# Patient Record
Sex: Female | Born: 2000 | Race: White | Hispanic: Yes | Marital: Single | Smoking: Current every day smoker
Health system: Southern US, Community
[De-identification: ages and names within clinical notes are randomized; demographics above are authoritative.]

## PROBLEM LIST (undated history)

## (undated) HISTORY — PX: APPENDECTOMY: SHX54

---
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
## 2019-03-27 ENCOUNTER — Ambulatory Visit: Payer: Self-pay | Attending: Internal Medicine

## 2019-03-27 DIAGNOSIS — Z20822 Contact with and (suspected) exposure to covid-19: Secondary | ICD-10-CM | POA: Insufficient documentation

## 2019-03-29 LAB — NOVEL CORONAVIRUS, NAA: SARS-CoV-2, NAA: NOT DETECTED

## 2019-12-20 ENCOUNTER — Encounter (HOSPITAL_COMMUNITY): Payer: Self-pay | Admitting: Emergency Medicine

## 2019-12-20 ENCOUNTER — Other Ambulatory Visit: Payer: Self-pay

## 2019-12-20 ENCOUNTER — Emergency Department (HOSPITAL_COMMUNITY)
Admission: EM | Admit: 2019-12-20 | Discharge: 2019-12-20 | Disposition: A | Discharge status: Home / Self Care | Payer: Self-pay | Attending: Emergency Medicine | Admitting: Emergency Medicine

## 2019-12-20 ENCOUNTER — Emergency Department (HOSPITAL_COMMUNITY): Payer: Self-pay

## 2019-12-20 DIAGNOSIS — R112 Nausea with vomiting, unspecified: Secondary | ICD-10-CM | POA: Insufficient documentation

## 2019-12-20 DIAGNOSIS — K219 Gastro-esophageal reflux disease without esophagitis: Secondary | ICD-10-CM | POA: Insufficient documentation

## 2019-12-20 DIAGNOSIS — R197 Diarrhea, unspecified: Secondary | ICD-10-CM | POA: Insufficient documentation

## 2019-12-20 DIAGNOSIS — F17210 Nicotine dependence, cigarettes, uncomplicated: Secondary | ICD-10-CM | POA: Insufficient documentation

## 2019-12-20 LAB — COMPREHENSIVE METABOLIC PANEL
ALT: 13 U/L (ref 0–44)
AST: 20 U/L (ref 15–41)
Albumin: 4.5 g/dL (ref 3.5–5.0)
Alkaline Phosphatase: 48 U/L (ref 38–126)
Anion gap: 10 (ref 5–15)
BUN: 10 mg/dL (ref 6–20)
CO2: 23 mmol/L (ref 22–32)
Calcium: 9.6 mg/dL (ref 8.9–10.3)
Chloride: 104 mmol/L (ref 98–111)
Creatinine, Ser: 0.76 mg/dL (ref 0.44–1.00)
GFR, Estimated: >60 mL/min (ref >60)
Glucose, Bld: 111 mg/dL — ABNORMAL HIGH (ref 70–99)
Potassium: 3.6 mmol/L (ref 3.5–5.1)
Sodium: 137 mmol/L (ref 135–145)
Total Bilirubin: 1 mg/dL (ref 0.3–1.2)
Total Protein: 7.6 g/dL (ref 6.5–8.1)

## 2019-12-20 LAB — CBC
HCT: 39.8 % (ref 36.0–46.0)
Hemoglobin: 13.1 g/dL (ref 12.0–15.0)
MCH: 29.8 pg (ref 26.0–34.0)
MCHC: 32.9 g/dL (ref 30.0–36.0)
MCV: 90.7 fL (ref 80.0–100.0)
Platelets: 288 10*3/uL (ref 150–400)
RBC: 4.39 MIL/uL (ref 3.87–5.11)
RDW: 13.4 % (ref 11.5–15.5)
WBC: 5.6 10*3/uL (ref 4.0–10.5)
nRBC: 0 % (ref 0.0–0.2)

## 2019-12-20 LAB — I-STAT BETA HCG BLOOD, ED (MC, WL, AP ONLY): I-stat hCG, quantitative: <5 m[IU]/mL (ref <5)

## 2019-12-20 LAB — LIPASE, BLOOD: Lipase: 27 U/L (ref 11–51)

## 2019-12-20 MED ORDER — FENTANYL CITRATE (PF) 100 MCG/2ML IJ SOLN
50.0000 ug | Freq: Once | INTRAMUSCULAR | Status: AC
  Administered 2019-12-20: 50 ug via INTRAVENOUS
  Filled 2019-12-20: qty 2

## 2019-12-20 MED ORDER — SODIUM CHLORIDE 0.9 % IV BOLUS
1000.0000 mL | Freq: Once | INTRAVENOUS | Status: AC
  Administered 2019-12-20: 1000 mL via INTRAVENOUS

## 2019-12-20 MED ORDER — OMEPRAZOLE 20 MG PO CPDR: 20.0000 mg | DELAYED_RELEASE_CAPSULE | Freq: Every day | ORAL | 1 refills | Status: AC

## 2019-12-20 MED ORDER — IOHEXOL 300 MG/ML  SOLN
100.0000 mL | Freq: Once | INTRAMUSCULAR | Status: AC | PRN
  Administered 2019-12-20: 100 mL via INTRAVENOUS

## 2019-12-20 MED ORDER — PANTOPRAZOLE SODIUM 40 MG IV SOLR
40.0000 mg | Freq: Once | INTRAVENOUS | Status: AC
  Administered 2019-12-20: 40 mg via INTRAVENOUS
  Filled 2019-12-20: qty 40

## 2019-12-20 MED ORDER — ONDANSETRON HCL 4 MG/2ML IJ SOLN
4.0000 mg | Freq: Once | INTRAMUSCULAR | Status: AC
  Administered 2019-12-20: 4 mg via INTRAVENOUS
  Filled 2019-12-20: qty 2

## 2019-12-20 NOTE — ED Notes (Signed)
Pt complaining of 10/10 diffuse abdominal pain, describes abdomen of feeling hard.  Pt is crouch on side of bed,states this more comfortable.  Pt states pain woke her up from sleep, states she felt fine prior to going to sleep last night

## 2019-12-20 NOTE — ED Triage Notes (Signed)
Pt reports lower abd pain, nausea, vomiting, and diarrhea that started this morning.

## 2019-12-20 NOTE — ED Provider Notes (Signed)
[facility] EMERGENCY DEPARTMENT Provider Note   CSN: [id number] Arrival date & time: 12/20/19  7619     History Chief Complaint  Patient presents with  . Abdominal Pain    Lauren Fields is a 19 y.o. female with PMH of appendectomy presents to the ED with acute onset abdominal pain with associated nausea that woke her from her sleep this morning.  On my examination, patient is complaining of 10 out of 10 pain.  She is crumpled up on the floor tearful and having difficulty moving.  Her husband is at bedside who reports that he had to carry her to the ED.  She states that this feels similar to her previous appendicitis, however worse.  She reports that her last menses was approximately 3 weeks ago and has low suspicion for pregnancy.  She states that she had an episode of relatively loose stools this morning and is still passing gas.  She also is endorsing mild nausea and had one episode of nonbloody emesis.  She denies any chest pain or shortness of breath.  No other abdominal surgeries.  No obvious precipitating injury.  She felt perfectly fine when she went to bed last evening.  No recent fevers or chills, infection, or other symptoms.  She denies any vaginal bleeding or discharge.  HPI     History reviewed. No pertinent past medical history.  There are no problems to display for this patient.   Past Surgical History:  Procedure Laterality Date  . APPENDECTOMY       OB History   No obstetric history on file.     No family history on file.  Social History   Tobacco Use  . Smoking status: Current Every Day Smoker  . Smokeless tobacco: Never Used  Substance Use Topics  . Alcohol use: Never  . Drug use: Never    Home Medications Prior to Admission medications   Medication Sig Start Date End Date Taking? Authorizing Provider  omeprazole (PRILOSEC) 20 MG capsule Take 1 capsule (20 mg total) by mouth daily. 12/20/19   Lorelee New,  PA-C  ondansetron (ZOFRAN ODT) 4 MG disintegrating tablet Take 1 tablet (4 mg total) by mouth every 8 (eight) hours as needed for nausea or vomiting. 05/23/17   Michela Pitcher A, PA-C    Allergies    Patient has no known allergies.  Review of Systems   Review of Systems All other systems reviewed and negative.   Physical Exam Updated Vital Signs BP 121/76   Pulse 72   Temp 97.6 F (36.4 C) (Oral)   Resp 16   LMP 11/29/2019   SpO2 100%   Physical Exam Physical Exam Vitals and nursing note reviewed. Exam conducted with a chaperone present.  HENT:     Head: Normocephalic and atraumatic.  Eyes:     General: No scleral icterus.    Conjunctiva/sclera: Conjunctivae normal.  Cardiovascular:     Rate and Rhythm: Normal rate.  Regular rhythm.      Pulses: Normal pulses.   Pulmonary:     Effort: Pulmonary effort is normal. No respiratory distress.     Breath sounds: Normal breath sounds. No wheezing or rales.  Abdominal: Soft, nondistended.  No significant TTP noted diffusely.  Mild guarding.  No peritoneal signs.  No overlying skin changes.  No obvious masses appreciated. Musculoskeletal:     Right lower leg: No edema.     Left lower leg: No edema.  Skin:  General: Skin is dry.     Capillary Refill: Capillary refill takes less than 2 seconds.  Neurological:     Mental Status: She is alert and oriented to person, place, and time.     GCS: GCS eye subscore is 4. GCS verbal subscore is 5. GCS motor subscore is 6.  Psychiatric:        Mood and Affect: Mood normal.        Behavior: Behavior normal.        Thought Content: Thought content normal.   ED Results / Procedures / Treatments   Labs (all labs ordered are listed, but only abnormal results are displayed) Labs Reviewed  COMPREHENSIVE METABOLIC PANEL - Abnormal; Notable for the following components:      Result Value   Glucose, Bld 111 (*)    All other components within normal limits  LIPASE, BLOOD  CBC  URINALYSIS,  ROUTINE W REFLEX MICROSCOPIC  I-STAT BETA HCG BLOOD, ED ([facility], [facility], [facility] ONLY)    EKG None  Radiology CT ABDOMEN PELVIS W CONTRAST  Result Date: 12/20/2019 CLINICAL DATA:  Abdominal pain, nausea, vomiting, and diarrhea. Symptoms started this morning. EXAM: CT ABDOMEN AND PELVIS WITH CONTRAST TECHNIQUE: Multidetector CT imaging of the abdomen and pelvis was performed using the standard protocol following bolus administration of intravenous contrast. CONTRAST:  OMNIPAQUE IOHEXOL 300 MG/ML  SOLN COMPARISON:  None. FINDINGS: Lower chest: No acute abnormality. Hepatobiliary: No focal liver abnormality is seen. No radiopaque gallstones, biliary dilatation, or pericholecystic inflammatory changes. Pancreas: Unremarkable. No pancreatic ductal dilatation or surrounding inflammatory changes. Spleen: Normal in size without focal abnormality. Adrenals/Urinary Tract: Adrenal glands are unremarkable. Kidneys are normal, without renal calculi, focal lesion, or hydronephrosis. Bladder is unremarkable. Stomach/Bowel: Stomach and small bowel loops are unremarkable. Prior appendectomy. Vascular/Lymphatic: No significant vascular findings are present. No enlarged abdominal or pelvic lymph nodes. Reproductive: Uterus is present.  No adnexal mass. Other: No free pelvic fluid. Anterior abdominal wall is unremarkable. Musculoskeletal: No acute or significant osseous findings. IMPRESSION: 1. No evidence for acute abnormality. 2. Prior appendectomy. Electronically Signed   By: Norva Pavlov M.D.   On: 12/20/2019 12:10    Procedures Procedures (including critical care time)  Medications Ordered in ED Medications  fentaNYL (SUBLIMAZE) injection 50 mcg (50 mcg Intravenous Given 12/20/19 1130)  ondansetron (ZOFRAN) injection 4 mg (4 mg Intravenous Given 12/20/19 1130)  sodium chloride 0.9 % bolus 1,000 mL (0 mLs Intravenous Stopped 12/20/19 1230)  iohexol (OMNIPAQUE) 300 MG/ML solution 100 mL (100 mLs Intravenous  Contrast Given 12/20/19 1143)  pantoprazole (PROTONIX) injection 40 mg (40 mg Intravenous Given 12/20/19 1408)    ED Course  I have reviewed the triage vital signs and the nursing notes.  Pertinent labs & imaging results that were available during my care of the patient were reviewed by me and considered in my medical decision making (see chart for details).    MDM Rules/Calculators/A&P                          Patient's history and physical exam is concerning for obstruction versus ovarian torsion.  Beta-hCG is negative.  She is denying any vaginal pain, discharge, or bleeding.  She is pointing towards her periumbilical region when describing her pain symptoms.  Laboratory work-up is already been obtained.  Will place IV and provide fentanyl for pain relief in addition to Zofran for nausea symptoms.  We will also provide 1 L IV NS  given her episode of loose stools and emesis.  Her vital signs are stable within normal limits.  While her laboratory work-up is reassuring, she is exquisitely tender on exam and writhing in discomfort.  Will obtain CT abdomen and pelvis with contrast for evaluation of acute intra-abdominal/pelvic pathology.  Labs CBC, CMP, lipase, and i-STAT beta-hCG obtained and entirely unremarkable.  Patient denies any urinary symptoms.  She went to the bathroom during her ED encounter, but urine sample was never collected.  Imaging CT abdomen pelvis was personally reviewed and demonstrates no evidence of acute abnormality.  There are no adnexal masses seen.  No renal stones or hydroureter.  On subsequent evaluation, patient is feeling very much improved with the fentanyl.  She is still having some mild epigastric abdominal burning discomfort.  She states that last night she was out celebrating Halloween drinking alcohol.  She adamantly denies any melena or recent increased NSAID use or smoking.  Lower suspicion for PUD at this time.  Will administer Protonix.  On final  evaluation, patient is feeling entirely improved after Protonix.  She feels prepared for discharge.  She also tells me that she often will have globus sensation and mild GERD symptoms.  I feel as though it is reasonable to treat her GERD with omeprazole 20 mg daily x30 days.  She plans to follow-up with [place] [facility] in the interim to get established with a primary care provider.  Also encouraging Maalox as needed for abortive therapy.  All of the evaluation and work-up results were discussed with the patient and any family at bedside.  Patient and/or family were informed that while patient is appropriate for discharge at this time, some medical emergencies may only develop or become detectable after a period of time.  I specifically instructed patient and/or family to return to return to the ED or seek immediate medical attention for any new or worsening symptoms.  They were provided opportunity to ask any additional questions and have none at this time.  Prior to discharge patient is feeling well, agreeable with plan for discharge home.  They have expressed understanding of verbal discharge instructions as well as return precautions and are agreeable to the plan.    Final Clinical Impression(s) / ED Diagnoses Final diagnoses:  Gastroesophageal reflux disease, unspecified whether esophagitis present    Rx / DC Orders ED Discharge Orders         Ordered    omeprazole (PRILOSEC) 20 MG capsule  Daily        12/20/19 1458           Lorelee New, PA-C 12/20/19 1501    Margarita Grizzle, MD 12/21/19 1429

## 2019-12-20 NOTE — Discharge Instructions (Signed)
Please take your omeprazole medications, as directed.  I encourage you to take Maalox over-the-counter as needed for symptomatic relief of your epigastric pain if it returns.  Please read the attachment on GERD.  I suspect that this is related to your globus sensation.  You will need to follow-up with primary care provider.  Please call the [facility] wellness [facility].  Return to the ED or seek immediate medical attention should you experience any new or worsening symptoms.

## 2019-12-31 NOTE — Progress Notes (Deleted)
Patient ID: Lauren Fields, female   DOB: 10-16-2000, 19 y.o.   MRN: [id number]   After ED visit for abdominal pain  12/20/2019.   From A/P: Patient's history and physical exam is concerning for obstruction versus ovarian torsion.  Beta-hCG is negative.  She is denying any vaginal pain, discharge, or bleeding.  She is pointing towards her periumbilical region when describing her pain symptoms.  Laboratory work-up is already been obtained.  Will place IV and provide fentanyl for pain relief in addition to Zofran for nausea symptoms.  We will also provide 1 L IV NS given her episode of loose stools and emesis.  Her vital signs are stable within normal limits.  While her laboratory work-up is reassuring, she is exquisitely tender on exam and writhing in discomfort.  Will obtain CT abdomen and pelvis with contrast for evaluation of acute intra-abdominal/pelvic pathology.  Labs CBC, CMP, lipase, and i-STAT beta-hCG obtained and entirely unremarkable.  Patient denies any urinary symptoms.  She went to the bathroom during her ED encounter, but urine sample was never collected.  Imaging CT abdomen pelvis was personally reviewed and demonstrates no evidence of acute abnormality.  There are no adnexal masses seen.  No renal stones or hydroureter.  On subsequent evaluation, patient is feeling very much improved with the fentanyl.  She is still having some mild epigastric abdominal burning discomfort.  She states that last night she was out celebrating Halloween drinking alcohol.  She adamantly denies any melena or recent increased NSAID use or smoking.  Lower suspicion for PUD at this time.  Will administer Protonix.  On final evaluation, patient is feeling entirely improved after Protonix.  She feels prepared for discharge.  She also tells me that she often will have globus sensation and mild GERD symptoms.  I feel as though it is reasonable to treat her GERD with omeprazole 20 mg daily x30  days.  She plans to follow-up with [place] [facility] in the interim to get established with a primary care provider.  Also encouraging Maalox as needed for abortive therapy.  All of the evaluation and work-up results were discussed with the patient and any family at bedside.  Patient and/or family were informed that while patient is appropriate for discharge at this time, some medical emergencies may only develop or become detectable after a period of time.  I specifically instructed patient and/or family to return to return to the ED or seek immediate medical attention for any new or worsening symptoms.  They were provided opportunity to ask any additional questions and have none at this time.  Prior to discharge patient is feeling well, agreeable with plan for discharge home.  They have expressed understanding of verbal discharge instructions as well as return precautions and are agreeable to the plan.

## 2020-01-06 ENCOUNTER — Inpatient Hospital Stay: Payer: Self-pay | Admitting: Physician Assistant

## 2021-12-28 IMAGING — CT CT ABD-PELV W/ CM
2 of 4 series · 17 of 46 positions shown, 19 images · IV contrast (Omni 300)
Comparison: None.

CLINICAL DATA: Abdominal pain, nausea, vomiting, and diarrhea.
Symptoms started this morning.

EXAM:
CT ABDOMEN AND PELVIS WITH CONTRAST
TECHNIQUE: Multidetector CT imaging of the abdomen and pelvis was performed
using the standard protocol following bolus administration of
intravenous contrast.
CONTRAST:  100mL OMNIPAQUE IOHEXOL 300 MG/ML  SOLN

[Series 3: a/p w/ 5mm · axial · 0.85mm/px · z∈[+758,+1168]mm · 14 of 90 slices shown, 16 images]
[im 4/90  soft-tissue]
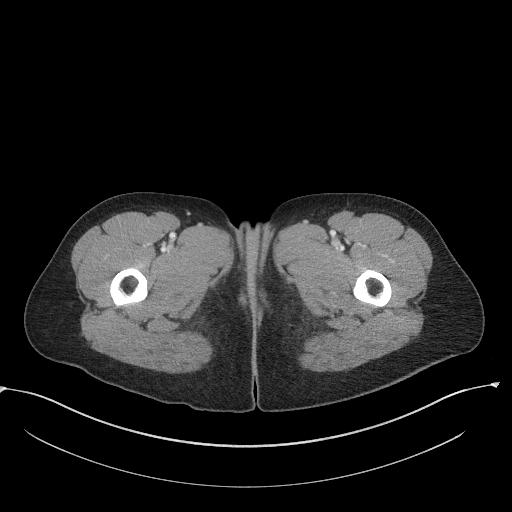
[im 4/90  bone]
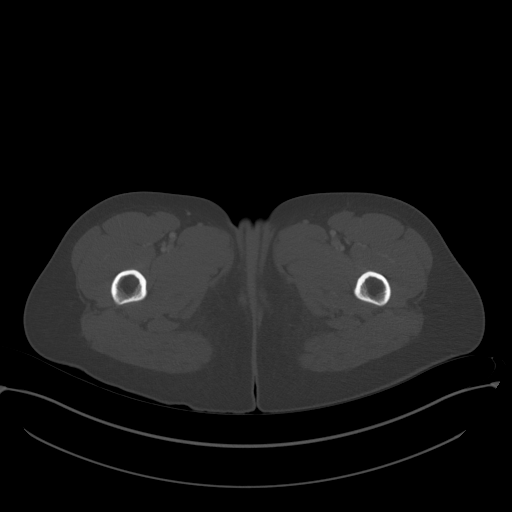
[im 12/90  soft-tissue]
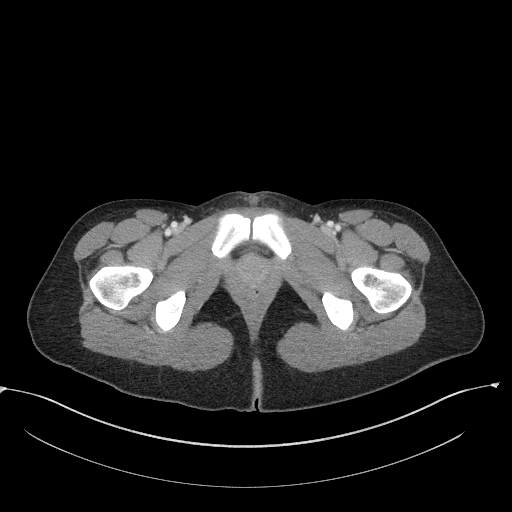
[im 16/90  soft-tissue]
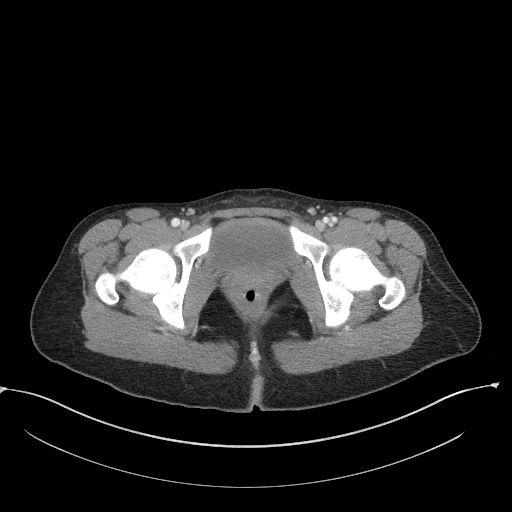
[im 24/90  soft-tissue]
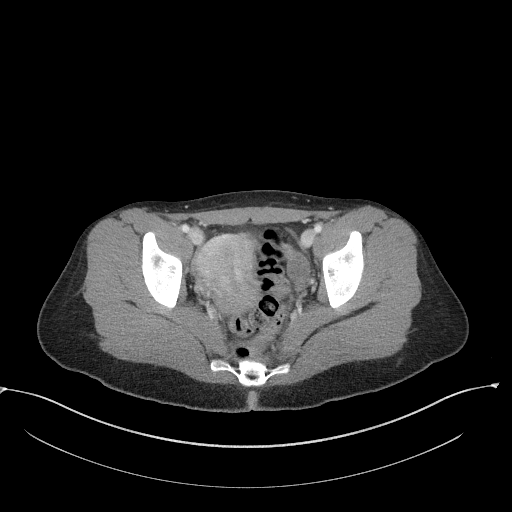
[im 31/90  soft-tissue]
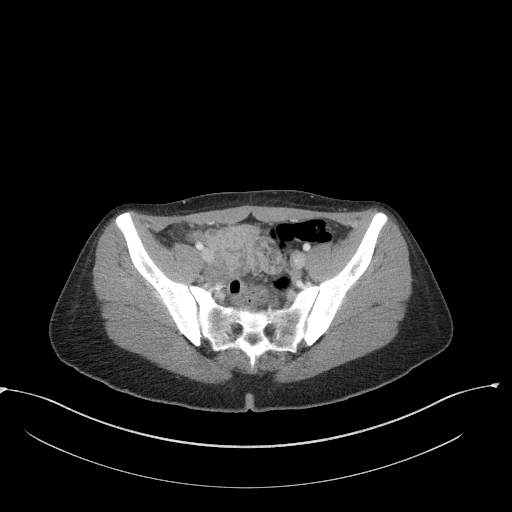
[im 35/90  soft-tissue]
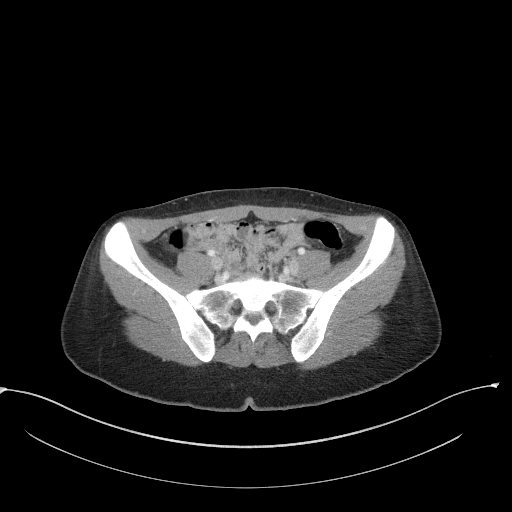
[im 43/90  soft-tissue]
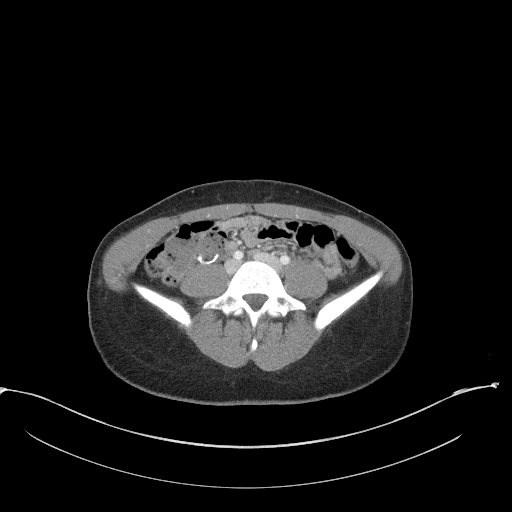
[im 47/90  soft-tissue]
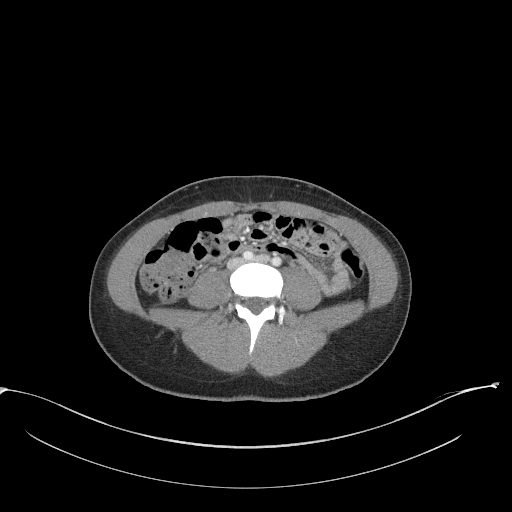
[im 55/90  soft-tissue]
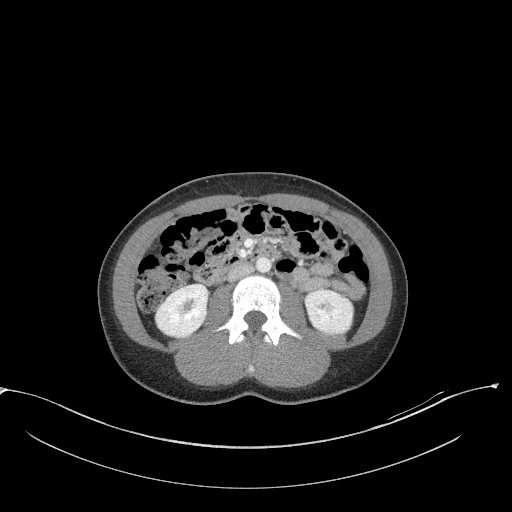
[im 55/90  bone]
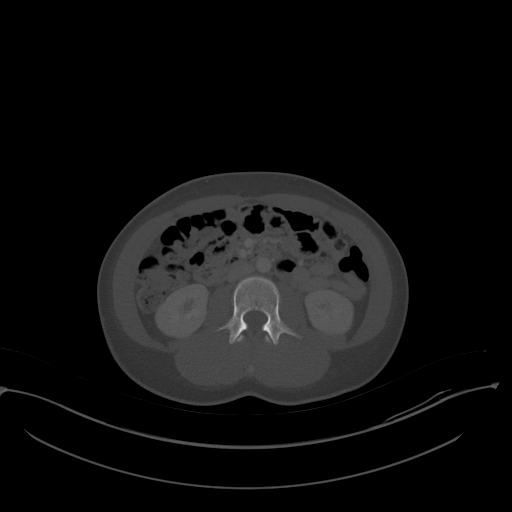
[im 59/90  soft-tissue]
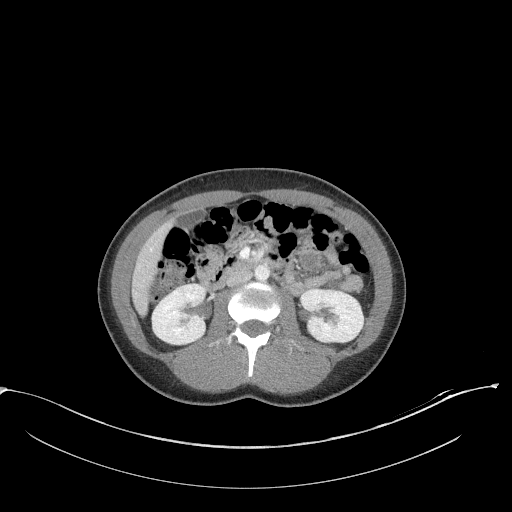
[im 66/90  soft-tissue]
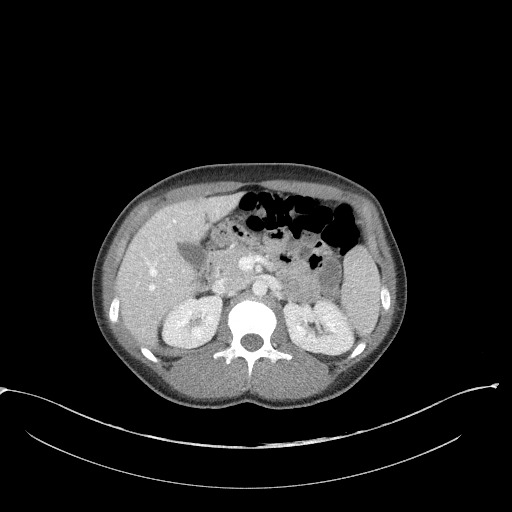
[im 74/90  soft-tissue]
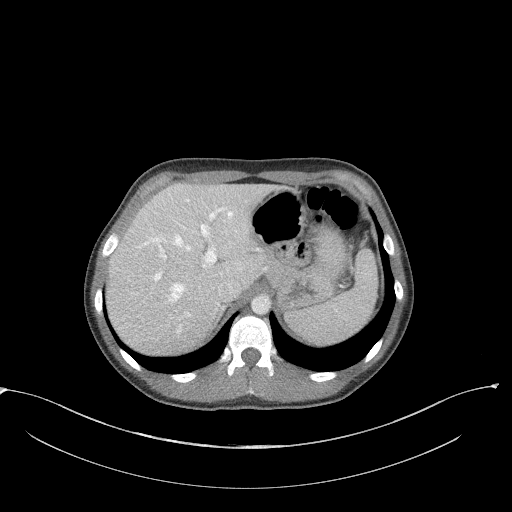
[im 78/90  soft-tissue]
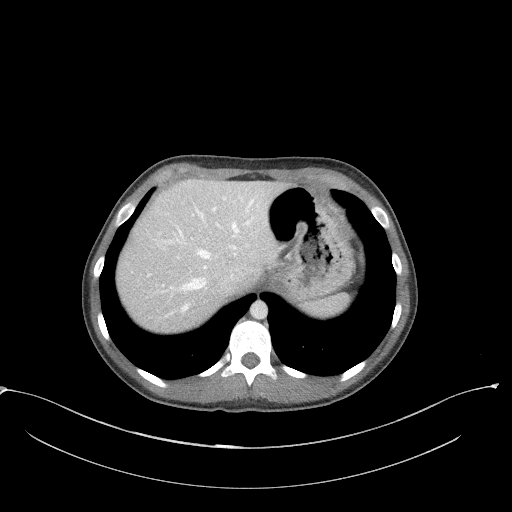
[im 86/90  soft-tissue]
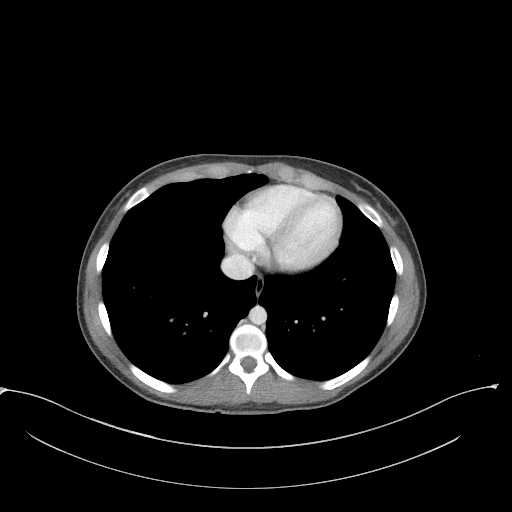

[Series 6: a/p w/ cor · coronal · 0.81mm/px · 3 of 133 slices shown]
[im 45/133  soft-tissue]
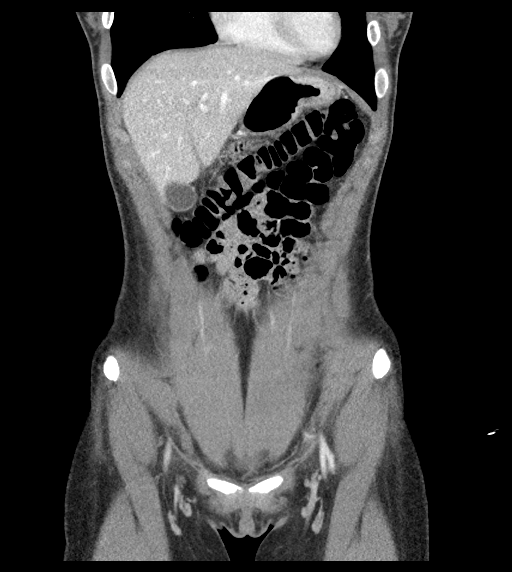
[im 59/133  soft-tissue]
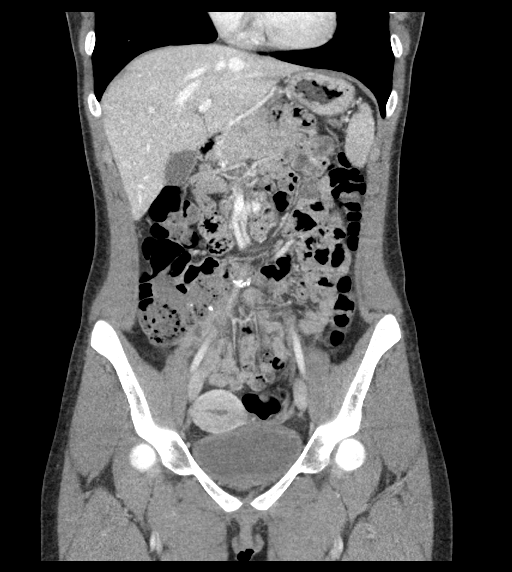
[im 74/133  soft-tissue]
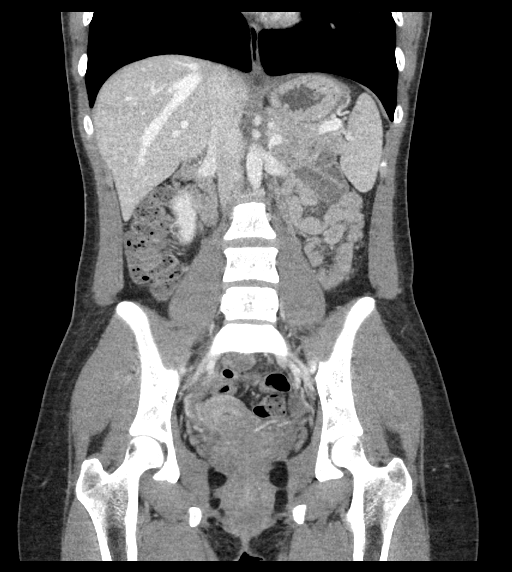

[17 of 46 positions shown; findings below may reference images not displayed]

FINDINGS: Lower chest: No acute abnormality.

Hepatobiliary: No focal liver abnormality is seen. No radiopaque
gallstones, biliary dilatation, or pericholecystic inflammatory
changes.

Pancreas: Unremarkable. No pancreatic ductal dilatation or
surrounding inflammatory changes.

Spleen: Normal in size without focal abnormality.

Adrenals/Urinary Tract: Adrenal glands are unremarkable. Kidneys are
normal, without renal calculi, focal lesion, or hydronephrosis.
Bladder is unremarkable.

Stomach/Bowel: Stomach and small bowel loops are unremarkable. Prior
appendectomy.

Vascular/Lymphatic: No significant vascular findings are present. No
enlarged abdominal or pelvic lymph nodes.

Reproductive: Uterus is present.  No adnexal mass.

Other: No free pelvic fluid. Anterior abdominal wall is
unremarkable.

Musculoskeletal: No acute or significant osseous findings.
IMPRESSION: 1. No evidence for acute abnormality.
2. Prior appendectomy.
